# Patient Record
Sex: Female | Born: 1970 | Hispanic: Yes | Marital: Single
Health system: Southern US, Community
[De-identification: ages and names within clinical notes are randomized; demographics above are authoritative.]

---

## 2000-04-11 HISTORY — PX: AUGMENTATION MAMMAPLASTY: SUR837

## 2005-05-03 ENCOUNTER — Other Ambulatory Visit: Payer: Self-pay

## 2005-05-03 ENCOUNTER — Inpatient Hospital Stay: Payer: Self-pay | Admitting: Internal Medicine

## 2007-12-24 ENCOUNTER — Emergency Department: Payer: Self-pay | Admitting: Emergency Medicine

## 2007-12-24 ENCOUNTER — Other Ambulatory Visit: Payer: Self-pay

## 2011-11-27 ENCOUNTER — Emergency Department: Payer: Self-pay | Admitting: Emergency Medicine

## 2011-11-27 LAB — COMPREHENSIVE METABOLIC PANEL
Albumin: 3.6 g/dL (ref 3.4–5.0)
Alkaline Phosphatase: 124 U/L (ref 50–136)
Anion Gap: 7 (ref 7–16)
Calcium, Total: 8.9 mg/dL (ref 8.5–10.1)
Co2: 28 mmol/L (ref 21–32)
EGFR (African American): >60
EGFR (Non-African Amer.): >60
Osmolality: 280 (ref 275–301)
Potassium: 3.8 mmol/L (ref 3.5–5.1)
SGOT(AST): 28 U/L (ref 15–37)
SGPT (ALT): 37 U/L (ref 12–78)
Sodium: 140 mmol/L (ref 136–145)

## 2011-11-27 LAB — CBC WITH DIFFERENTIAL/PLATELET
Basophil %: 0.7 %
Eosinophil %: 2.7 %
HCT: 41.2 % (ref 35.0–47.0)
HGB: 13.8 g/dL (ref 12.0–16.0)
Lymphocyte #: 2.7 10*3/uL (ref 1.0–3.6)
Lymphocyte %: 30.6 %
MCV: 88 fL (ref 80–100)
Monocyte #: 0.8 x10 3/mm (ref 0.2–0.9)
Monocyte %: 9.3 %
Neutrophil #: 4.9 10*3/uL (ref 1.4–6.5)
WBC: 8.7 10*3/uL (ref 3.6–11.0)

## 2013-02-27 ENCOUNTER — Ambulatory Visit: Payer: Self-pay

## 2015-01-21 ENCOUNTER — Ambulatory Visit: Payer: Self-pay | Attending: Oncology

## 2015-01-21 ENCOUNTER — Ambulatory Visit
Admission: RE | Admit: 2015-01-21 | Discharge: 2015-01-21 | Disposition: A | Discharge status: Home / Self Care | Payer: Self-pay | Source: Ambulatory Visit | Attending: Oncology | Admitting: Oncology

## 2015-01-21 ENCOUNTER — Other Ambulatory Visit: Payer: Self-pay

## 2015-01-21 VITALS — BP 111/74 | HR 76 | Temp 97.1°F | Ht 68.0 in | Wt 183.4 lb

## 2015-01-21 DIAGNOSIS — Z0000 Encounter for general adult medical examination without abnormal findings: Secondary | ICD-10-CM

## 2015-01-21 NOTE — Progress Notes (Signed)
Subjective:     Patient ID: Angela Sharp, female   DOB: 08-21-1970, 44 y.o.   MRN: [id number]  HPI   Review of Systems     Objective:   Physical Exam  Pulmonary/Chest: Right breast exhibits no inverted nipple, no mass, no nipple discharge, no skin change and no tenderness. Left breast exhibits no inverted nipple, no mass, no nipple discharge, no skin change and no tenderness. Breasts are symmetrical.  Bilateral breast implants       Assessment:     44 year old hispanic patient presents for [facility] clinic visit.  Patient screened, and meets [facility] eligibility.  Patient does not have insurance, Medicare or Medicaid.  Handout given on Affordable Care Act.  Patient is transgender, and has bilateral breast implants. CBE unremarkable. Instructed patient on breast self-exam using teach back method    Plan:     Sent for bilateral screening mammogram.

## 2015-01-22 ENCOUNTER — Other Ambulatory Visit: Payer: Self-pay | Admitting: *Deleted

## 2015-01-22 DIAGNOSIS — N63 Unspecified lump in unspecified breast: Secondary | ICD-10-CM

## 2015-02-11 ENCOUNTER — Ambulatory Visit
Admission: RE | Admit: 2015-02-11 | Discharge: 2015-02-11 | Disposition: A | Discharge status: Home / Self Care | Payer: Self-pay | Source: Ambulatory Visit | Attending: Oncology | Admitting: Oncology

## 2015-02-11 DIAGNOSIS — N63 Unspecified lump in unspecified breast: Secondary | ICD-10-CM

## 2015-03-19 ENCOUNTER — Other Ambulatory Visit: Payer: Self-pay

## 2015-03-19 DIAGNOSIS — N63 Unspecified lump in unspecified breast: Secondary | ICD-10-CM

## 2015-04-07 NOTE — Progress Notes (Signed)
Letter mailed to patient to inform of scheduled six month follow-up ultrasound of right breast scheduled for 08/17/14 at 1:40.  Also left message for schedulers in [facility] regarding duplicate appointment on 08/18/14 . Unsure of reason for duplication, but Percell BostonJamie Bunting [facility] is correcting this.

## 2015-08-17 ENCOUNTER — Ambulatory Visit
Admission: RE | Admit: 2015-08-17 | Discharge: 2015-08-17 | Disposition: A | Discharge status: Home / Self Care | Payer: Self-pay | Source: Ambulatory Visit | Attending: Oncology | Admitting: Oncology

## 2015-08-17 DIAGNOSIS — N63 Unspecified lump in unspecified breast: Secondary | ICD-10-CM

## 2015-08-18 ENCOUNTER — Other Ambulatory Visit: Payer: Self-pay

## 2015-10-15 NOTE — Progress Notes (Signed)
Patient had a birads 3 result from ultrasound on Aug 17, 2015.   She is scheduled for annual [facility] screening, and will need diagnostic mammogram and ultrasound on that date.  Letter mailed to notify patient of appointment.  Copy to [facility]IS.

## 2016-02-17 ENCOUNTER — Ambulatory Visit: Payer: Self-pay | Attending: Oncology

## 2016-03-21 ENCOUNTER — Ambulatory Visit: Payer: Self-pay | Attending: Oncology

## 2016-03-21 ENCOUNTER — Ambulatory Visit
Admission: RE | Admit: 2016-03-21 | Discharge: 2016-03-21 | Disposition: A | Discharge status: Home / Self Care | Payer: Self-pay | Source: Ambulatory Visit | Attending: Oncology | Admitting: Oncology

## 2016-03-21 ENCOUNTER — Encounter (INDEPENDENT_AMBULATORY_CARE_PROVIDER_SITE_OTHER): Payer: Self-pay

## 2016-03-21 ENCOUNTER — Other Ambulatory Visit: Payer: Self-pay | Admitting: Oncology

## 2016-03-21 VITALS — BP 120/80 | HR 73 | Temp 97.9°F | Ht 68.5 in | Wt 189.3 lb

## 2016-03-21 DIAGNOSIS — Z0000 Encounter for general adult medical examination without abnormal findings: Secondary | ICD-10-CM

## 2016-03-21 DIAGNOSIS — N63 Unspecified lump in unspecified breast: Secondary | ICD-10-CM

## 2016-03-21 DIAGNOSIS — Z9882 Breast implant status: Secondary | ICD-10-CM | POA: Insufficient documentation

## 2016-03-21 DIAGNOSIS — Z1231 Encounter for screening mammogram for malignant neoplasm of breast: Secondary | ICD-10-CM | POA: Insufficient documentation

## 2016-03-21 NOTE — Progress Notes (Signed)
Subjective:     Patient ID: Angela Sharp, female   DOB: 1970/10/20, 45 y.o.   MRN: [id number]  HPI   Review of Systems     Objective:   Physical Exam  Pulmonary/Chest: Right breast exhibits no inverted nipple, no mass, no nipple discharge, no skin change and no tenderness. Left breast exhibits no inverted nipple, no mass, no nipple discharge, no skin change and no tenderness. Breasts are symmetrical.         Assessment:     45 year old patient presents for annual [facility] appointment.  Patient screened, and meets [facility] eligibility.  Patient does not have insurance, Medicare or Medicaid.  Handout given on Affordable Care Act. Instructed patient on breast self-exam using teach back method. Normal CBE.  Patient is transgender, and has bilateral breast implants.  She is taking estrogen supplement.  Maritza Afanador interpreted exam.  Patient was being followed for right breast cysts, but has returned to annual screening.    Plan:     Sent for bilateral screening mammogram.

## 2016-03-23 NOTE — Progress Notes (Signed)
Letter mailed from [facility] to notify of normal mammogram results.  Patient to return in one year for annual screening.  Copy to [facility]IS. 

## 2016-10-04 ENCOUNTER — Encounter: Payer: Self-pay | Admitting: Podiatry

## 2016-10-04 ENCOUNTER — Ambulatory Visit (INDEPENDENT_AMBULATORY_CARE_PROVIDER_SITE_OTHER): Payer: Self-pay | Admitting: Podiatry

## 2016-10-04 VITALS — BP 105/70 | HR 67 | Resp 16 | Ht 67.0 in | Wt 172.0 lb

## 2016-10-04 DIAGNOSIS — B359 Dermatophytosis, unspecified: Secondary | ICD-10-CM

## 2016-10-04 DIAGNOSIS — L603 Nail dystrophy: Secondary | ICD-10-CM

## 2016-10-04 DIAGNOSIS — B351 Tinea unguium: Secondary | ICD-10-CM

## 2016-10-04 MED ORDER — TERBINAFINE HCL 250 MG PO TABS: 250.0000 mg | ORAL_TABLET | Freq: Every day | ORAL | 0 refills | Status: AC

## 2016-10-04 NOTE — Progress Notes (Signed)
   Subjective:    Patient ID: Angela HailGladys Rivas Parra, female    DOB: March 09, 1971, 46 y.o.   MRN: [id number]  HPI    Review of Systems  All other systems reviewed and are negative.      Objective:   Physical Exam        Assessment & Plan:

## 2016-10-05 NOTE — Progress Notes (Signed)
   Subjective: Patient presents today for possible treatment and evaluation of fungal nails of bilateral great toes that have been ongoing for the past two years. She states she has taken Lamisil in the past, finishing three months ago, with some relief. Patient presents today for further treatment and evaluation.  Objective: Physical Exam General: The patient is alert and oriented x3 in no acute distress.  Dermatology: Hyperkeratotic, discolored, thickened, onychodystrophy of nails noted bilaterally.  Skin is warm, dry and supple bilateral lower extremities. Negative for open lesions or macerations.  Vascular: Palpable pedal pulses bilaterally. No edema or erythema noted. Capillary refill within normal limits.  Neurological: Epicritic and protective threshold grossly intact bilaterally.   Musculoskeletal Exam: Range of motion within normal limits to all pedal and ankle joints bilateral. Muscle strength 5/5 in all groups bilateral.   Assessment: #1 onychodystrophy bilateral toenails #2 possible onychomycosis #3 hyperkeratotic nails bilateral  Plan of Care:  #1 Patient was evaluated. #2 Orders for liver function tests were ordered today.  #3 Today nail biopsy was taken and sent to pathology for fungal culture. #4 Prescription for Lamisil 250 mg #90 given to patient. #5 Appt with Shanda BumpsJessica, RN for laser treatment. #6 Return to clinic in 4 months.    Felecia ShellingBrent M. Evans, DPM [facility]  Dr. Felecia ShellingBrent M. Evans, DPM    [address]                                        [place], [place] [address]                Office [phone]  Fax [phone]

## 2016-10-07 LAB — HEPATIC FUNCTION PANEL
ALBUMIN: 4.5 g/dL (ref 3.5–5.5)
ALK PHOS: 69 IU/L (ref 39–117)
ALT: 28 IU/L (ref 0–32)
AST: 21 IU/L (ref 0–40)
BILIRUBIN TOTAL: 0.3 mg/dL (ref 0.0–1.2)
BILIRUBIN, DIRECT: 0.09 mg/dL (ref 0.00–0.40)
Total Protein: 7.2 g/dL (ref 6.0–8.5)

## 2016-10-18 ENCOUNTER — Ambulatory Visit (INDEPENDENT_AMBULATORY_CARE_PROVIDER_SITE_OTHER): Payer: Self-pay | Admitting: Podiatry

## 2016-10-18 DIAGNOSIS — B351 Tinea unguium: Secondary | ICD-10-CM

## 2016-10-18 DIAGNOSIS — B359 Dermatophytosis, unspecified: Secondary | ICD-10-CM

## 2016-10-18 NOTE — Progress Notes (Signed)
Pt presents with mycotic infection of nails 1-5 bilateral  All other systems are negative  Laser therapy administered to affected nails and tolerated well. All safety precautions were in place. Re-appointed in 4 weeks for 2nd treatment 

## 2016-11-22 ENCOUNTER — Other Ambulatory Visit: Payer: Self-pay

## 2016-11-22 DIAGNOSIS — B351 Tinea unguium: Secondary | ICD-10-CM

## 2016-11-30 ENCOUNTER — Ambulatory Visit (INDEPENDENT_AMBULATORY_CARE_PROVIDER_SITE_OTHER): Payer: Self-pay | Admitting: Podiatry

## 2016-11-30 DIAGNOSIS — B351 Tinea unguium: Secondary | ICD-10-CM

## 2016-12-05 NOTE — Progress Notes (Signed)
Pt presents with mycotic infection of nails 1-5 bilateral  All other systems are negative  Laser therapy administered to affected nails and tolerated well. All safety precautions were in place. Re-appointed in 4 weeks for 3rd treatment 

## 2016-12-28 ENCOUNTER — Ambulatory Visit: Payer: Self-pay

## 2016-12-28 DIAGNOSIS — B351 Tinea unguium: Secondary | ICD-10-CM

## 2016-12-28 DIAGNOSIS — B359 Dermatophytosis, unspecified: Secondary | ICD-10-CM

## 2016-12-28 MED ORDER — CLOTRIMAZOLE-BETAMETHASONE 1-0.05 % EX CREA: 1.0000 "application " | TOPICAL_CREAM | Freq: Two times a day (BID) | CUTANEOUS | 0 refills | Status: DC

## 2016-12-28 NOTE — Progress Notes (Signed)
Pt presents with mycotic infection of nails 1-5 bilateral  All other systems are negative  Laser therapy administered to affected nails and tolerated well. All safety precautions were in place. Re-appointed in 4 weeks for 4th treatment. Rx for lotrisone cream sent to pharmacy per Dr Logan Bores

## 2017-01-24 ENCOUNTER — Ambulatory Visit (INDEPENDENT_AMBULATORY_CARE_PROVIDER_SITE_OTHER): Payer: Self-pay | Admitting: Podiatry

## 2017-01-24 DIAGNOSIS — B351 Tinea unguium: Secondary | ICD-10-CM

## 2017-01-24 DIAGNOSIS — B359 Dermatophytosis, unspecified: Secondary | ICD-10-CM

## 2017-01-24 MED ORDER — CLOTRIMAZOLE-BETAMETHASONE 1-0.05 % EX CREA: 1.0000 "application " | TOPICAL_CREAM | Freq: Two times a day (BID) | CUTANEOUS | 0 refills | Status: DC

## 2017-01-24 NOTE — Progress Notes (Signed)
Pt presents with mycotic infection of nails 1-5 bilateral  All other systems are negative  Laser therapy administered to affected nails and tolerated well. All safety precautions were in place. Re-appointed in 4 weeks for 5th treatment. Refill for lotrisone cream sent to pharmacy per Dr Logan Bores

## 2017-01-26 ENCOUNTER — Ambulatory Visit: Payer: Self-pay

## 2017-02-28 ENCOUNTER — Ambulatory Visit (INDEPENDENT_AMBULATORY_CARE_PROVIDER_SITE_OTHER): Payer: Self-pay | Admitting: Podiatry

## 2017-02-28 DIAGNOSIS — B351 Tinea unguium: Secondary | ICD-10-CM

## 2017-02-28 DIAGNOSIS — B359 Dermatophytosis, unspecified: Secondary | ICD-10-CM

## 2017-02-28 MED ORDER — CLOTRIMAZOLE-BETAMETHASONE 1-0.05 % EX CREA: 1.0000 "application " | TOPICAL_CREAM | Freq: Two times a day (BID) | CUTANEOUS | 0 refills | Status: AC

## 2017-03-06 NOTE — Progress Notes (Signed)
Pt presents with mycotic infection of nails 1-5 bilateral  All other systems are negative  Laser therapy administered to affected nails and tolerated well. All safety precautions were in place. Re-appointed in 4 weeks for 6th treatment. Refill for lotrisone cream sent to pharmacy per Dr Logan BoresEvans

## 2017-03-28 ENCOUNTER — Ambulatory Visit: Payer: Self-pay

## 2017-03-31 ENCOUNTER — Ambulatory Visit: Payer: Self-pay | Admitting: Podiatry

## 2017-03-31 DIAGNOSIS — B351 Tinea unguium: Secondary | ICD-10-CM

## 2017-04-10 ENCOUNTER — Ambulatory Visit: Payer: Self-pay | Attending: Oncology | Admitting: *Deleted

## 2017-04-10 VITALS — BP 107/73 | HR 70 | Temp 97.5°F | Ht 68.0 in | Wt 194.0 lb

## 2017-04-10 DIAGNOSIS — Z0000 Encounter for general adult medical examination without abnormal findings: Secondary | ICD-10-CM

## 2017-04-10 NOTE — Progress Notes (Signed)
Subjective:     Patient ID: Angela Sharp, adult   DOB: 07-Sep-1970, 46 y.o.   MRN: [id number]  HPI   Review of Systems     Objective:   Physical Exam  Pulmonary/Chest: Right breast exhibits no inverted nipple, no mass, no nipple discharge, no skin change and no tenderness. Left breast exhibits no inverted nipple, no mass, no nipple discharge, no skin change and no tenderness. Breasts are symmetrical.         Assessment:     46 year old transgender Hispanic female [facility]  Orson SlickJacqui, the interpreter present during the interview and exam.  Patient states she is having her implants removed on Thursday, and that the saline has already been removed.  On clinical breast exam there is palpable mobile firm, probable implants bilateral at 6:00.  There is no dominant mass, skin changes, nipple discharge or lymphadenopathy.  I did call the breast center and spoke to [facility] about doing the patients mammogram today or waiting until after surgery.  Marcelino DusterMichelle states she spoke to the radiologist, who recommended that the patient return after surgery when the surgeon agrees it is ok to proceed with mammogram.  Explained to patient.  She is going to call me or Joellyn QuailsChristy Burton back to schedule her mammogram.  Taught self breast awareness.  Patient has been screened for eligibility.  She does not have any insurance, Medicare or Medicaid.  She also meets financial eligibility.  Hand-out given on the Affordable Care Act.    Plan:     Screening mammogram ordered.  Will follow-up per [facility] protocol.

## 2017-04-10 NOTE — Patient Instructions (Signed)
Gave patient hand-out, Women Staying Healthy, Active and Well from [facility], with education on breast health, pap smears, heart and colon health. 

## 2017-04-13 ENCOUNTER — Encounter: Payer: Self-pay | Admitting: *Deleted

## 2017-04-19 NOTE — Progress Notes (Signed)
Pt presents with mycotic infection of nails 1-5 bilateral  All other systems are negative  Laser therapy administered to affected nails and tolerated well. All safety precautions were in place. Re-appointed prn 

## 2017-05-06 IMAGING — MG MM DIGITAL SCREENING IMPLANTS W/ CAD
8 of 9 series · 8 of 9 positions shown · non-contrast
Comparison: Previous exam(s).

CLINICAL DATA: Screening.

EXAM:
DIGITAL SCREENING BILATERAL MAMMOGRAM WITH IMPLANTS AND CAD
The patient has retropectoral implants. Standard and implant
displaced views were performed.

[L CC (1 of 2)]
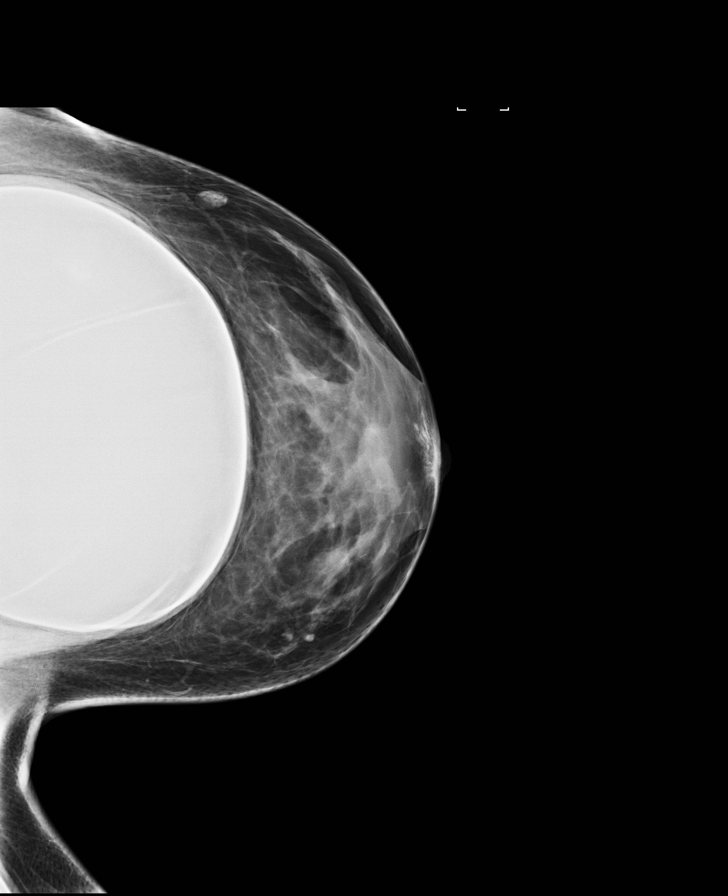

[L MLO (1 of 2)]
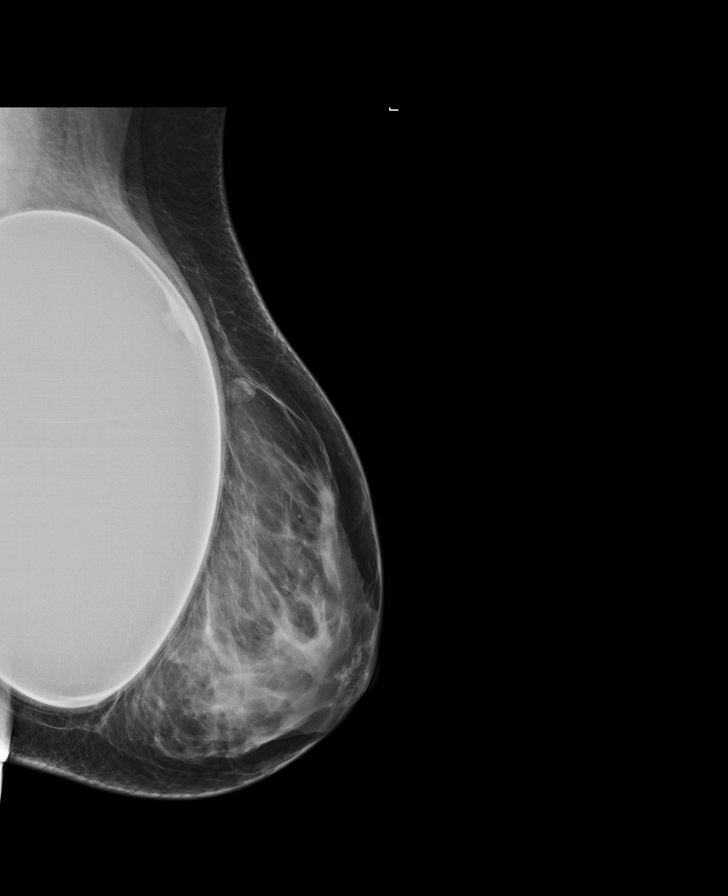

[R MLO (1 of 2)]
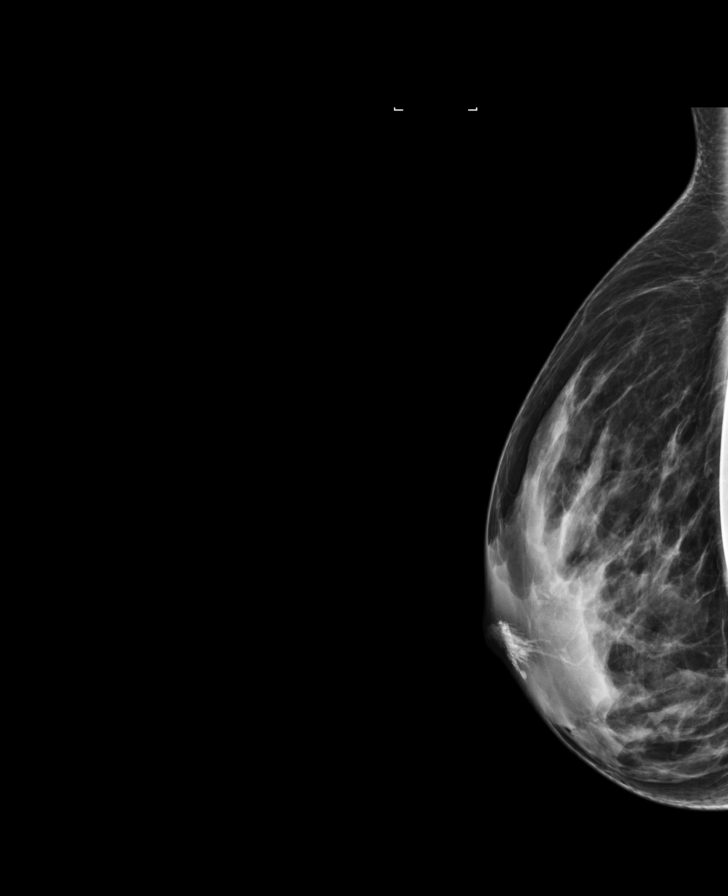

[R CC (1 of 2)]
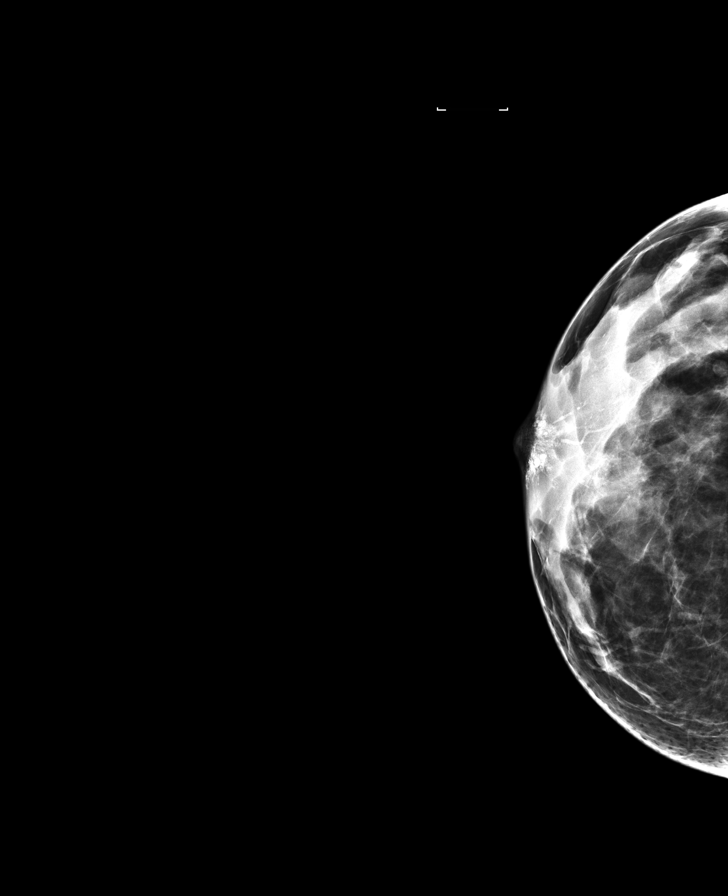

[R CC (2 of 2)]
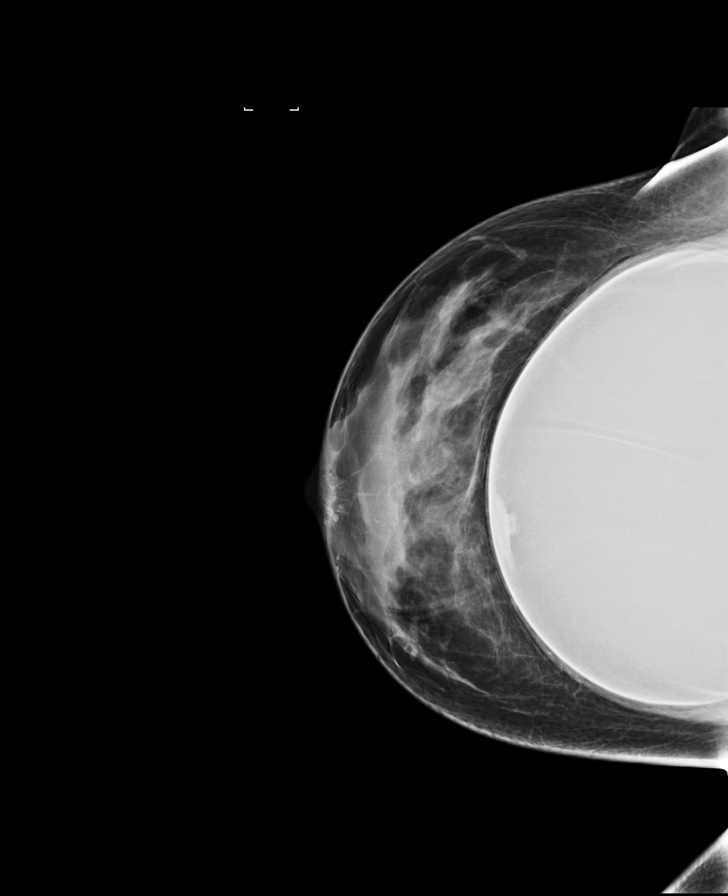

[L CC (2 of 2)]
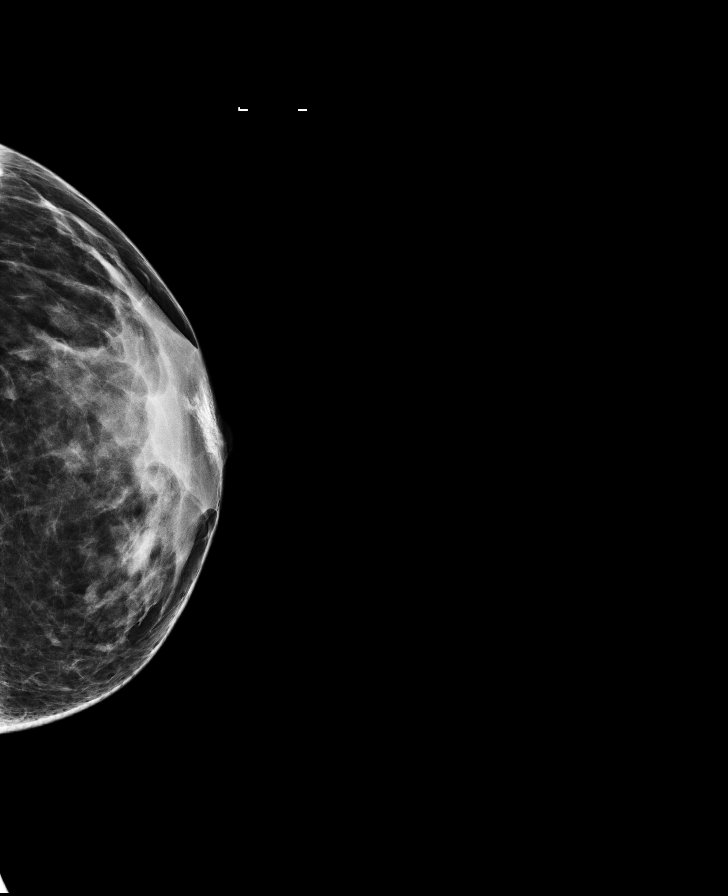

[R MLO (2 of 2)]
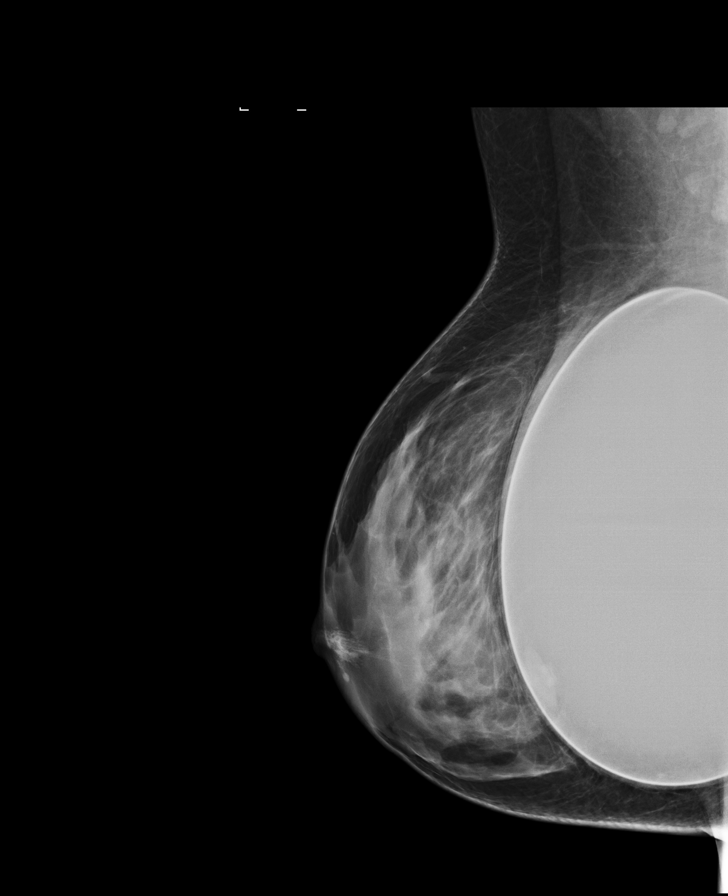

[L MLO (2 of 2)]
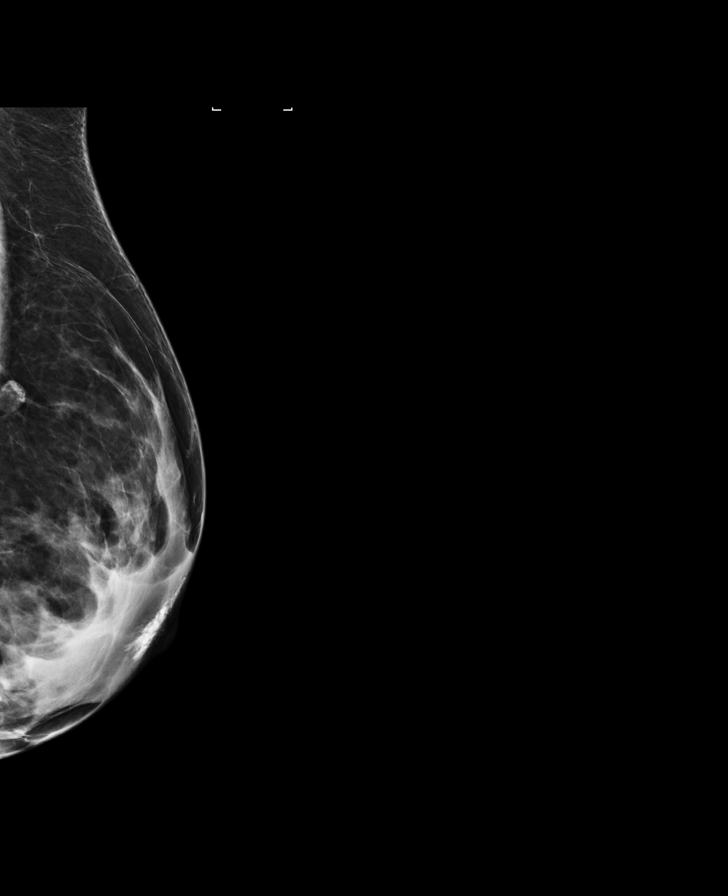

[8 of 9 positions shown; findings below may reference images not displayed]

ACR Breast Density Category c: The breast tissue is heterogeneously
dense, which may obscure small masses.
FINDINGS: In the right breast possible mass requires further evaluation.

In the left breast possible mass and asymmetry require further
evaluation.

Images were processed with CAD.
IMPRESSION: Further evaluation is suggested for possible mass in the right
breast.

Further evaluation is suggested for possible mass and asymmetry in
the left breast.

The patient will be contacted regarding the findings, and additional
imaging will be scheduled.

RECOMMENDATION:
Diagnostic mammogram and possibly ultrasound of both breasts.
(Code:V3-B-GGQ)

BI-RADS CATEGORY  0: Incomplete. Need additional imaging evaluation
and/or prior mammograms for comparison.

## 2017-07-04 ENCOUNTER — Telehealth: Payer: Self-pay | Admitting: *Deleted

## 2017-07-04 NOTE — Telephone Encounter (Addendum)
Tried to call patient to see if she had had her surgery.  No answer and no voicemail set up.  Will try again at a later date.  Patient returned my call.  States she had her surgery and she is scheduled for a follow up appointment in 6 months.  She is to call back at that time when she has approval to go forward with her next mammogram.  I will schedule her to return to [facility] at that time. She is agreeable to the plan.

## 2018-06-11 ENCOUNTER — Other Ambulatory Visit: Payer: Self-pay | Admitting: *Deleted

## 2018-06-11 ENCOUNTER — Ambulatory Visit: Payer: Self-pay | Attending: Oncology | Admitting: *Deleted

## 2018-06-11 ENCOUNTER — Encounter: Payer: Self-pay | Admitting: *Deleted

## 2018-06-11 ENCOUNTER — Ambulatory Visit
Admission: RE | Admit: 2018-06-11 | Discharge: 2018-06-11 | Disposition: A | Discharge status: Home / Self Care | Payer: Self-pay | Source: Ambulatory Visit | Attending: Oncology | Admitting: Oncology

## 2018-06-11 ENCOUNTER — Encounter (INDEPENDENT_AMBULATORY_CARE_PROVIDER_SITE_OTHER): Payer: Self-pay

## 2018-06-11 VITALS — BP 108/70 | HR 67 | Temp 98.5°F | Ht 69.0 in | Wt 182.5 lb

## 2018-06-11 DIAGNOSIS — Z0000 Encounter for general adult medical examination without abnormal findings: Secondary | ICD-10-CM

## 2018-06-11 NOTE — Progress Notes (Signed)
  Subjective:     Patient ID: Angela Sharp, adult   DOB: 1970-04-21, 48 y.o.   MRN: [id number]  HPI   Review of Systems     Objective:   Physical Exam Chest:     Breasts:        Right: Inverted nipple present. No swelling, bleeding, mass, nipple discharge, skin change or tenderness.        Left: Inverted nipple present. No swelling, bleeding, mass, nipple discharge, skin change or tenderness.       Comments: Bilateral nipples are inverted.  Patient states it has been since her last surgery and was told by her surgeon this was normal. Lymphadenopathy:     Upper Body:     Right upper body: No supraclavicular or axillary adenopathy.     Left upper body: No supraclavicular or axillary adenopathy.        Assessment:     48 year old Hispanic transgender female returns to [facility] for annual screening.  Lloyda, the interpreter present during the interview and exam.  Patient last seen in [facility]  on 04/10/17, and at that time she in the process of having her implants removed.  It was recommended at that time by radiology for her to wait for her mammogram until after her surgery was complete.  It has been a year since completion of her surgery.  On clinical breast exam bilateral nipples are inverted.  Patient states they have been this way since her surgery.  Asked patient if she discussed this with her surgeon, and she states he told her this was going to happen.  Taught self breast awareness.  She is currently using an estrogen patch.  Patient has been screened for eligibility.  She does not have any insurance, Medicare or Medicaid.  She also meets financial eligibility.  Hand-out given on the Affordable Care Act.  Risk Assessment    The Dondra Spry risk assessment model is based on data from cisgender female patients and might not be accurate for patients of other genders or sexes.   Risk Scores      06/11/2018   Last edited by: Alta Corning, CMA   5-year risk: 0.6 %   Lifetime risk: 6.9 %            Plan:     Screening mammogram ordered.  Will follow-up per [facility] protocol.

## 2018-06-11 NOTE — Patient Instructions (Signed)
Gave patient hand-out, Women Staying Healthy, Active and Well from [facility], with education on breast health, pap smears, heart and colon health. 

## 2018-06-12 ENCOUNTER — Encounter: Payer: Self-pay | Admitting: *Deleted

## 2018-06-12 NOTE — Progress Notes (Signed)
[facility] mailed [facility] to inform patient of her normal mammogram results.  Patient is to follow-up with annual screening in one year.  HSIS to Christy. 

## 2019-08-06 ENCOUNTER — Ambulatory Visit
Admission: RE | Admit: 2019-08-06 | Discharge: 2019-08-06 | Disposition: A | Discharge status: Home / Self Care | Payer: Self-pay | Source: Ambulatory Visit | Attending: Oncology | Admitting: Oncology

## 2019-08-06 ENCOUNTER — Other Ambulatory Visit: Payer: Self-pay

## 2019-08-06 ENCOUNTER — Ambulatory Visit: Payer: Self-pay | Attending: Oncology | Admitting: *Deleted

## 2019-08-06 ENCOUNTER — Encounter: Payer: Self-pay | Admitting: *Deleted

## 2019-08-06 VITALS — BP 113/79 | HR 64 | Temp 97.7°F | Ht 68.5 in | Wt 178.9 lb

## 2019-08-06 DIAGNOSIS — Z0000 Encounter for general adult medical examination without abnormal findings: Secondary | ICD-10-CM

## 2019-08-06 NOTE — Progress Notes (Signed)
  Subjective:     Patient ID: Angela Sharp, adult   DOB: 1970-06-15, 49 y.o.   MRN: [id number]  HPI   Review of Systems     Objective:   Physical Exam Chest:     Breasts:        Right: Inverted nipple present. No swelling, bleeding, mass, nipple discharge, skin change or tenderness.        Left: Inverted nipple present. No swelling, bleeding, mass, nipple discharge, skin change or tenderness.    Lymphadenopathy:     Upper Body:     Right upper body: No supraclavicular adenopathy.     Left upper body: No supraclavicular adenopathy.        Assessment:     49 year old transgender female returns to [facility] for annual screening.  Refused interpreter today.  On clinical breast exam, bilateral breast have inverted nipples.  Patient states they have been this way since her surgery.  Patient did have her implants removed in 2018/ 2019.  There are previous surgical scars noted.  There is no dominant mass, nipple discharge, skin changes or lymphadenopathy.  Taught self breast awareness.  Patient has been screened for eligibility.  She does not have any insurance, Medicare or Medicaid.  She also meets financial eligibility.  The gail model breast cancer risk does not apply to transgender.      Plan:     Screening mammogram ordered.  Patient did get her last Covid 19 vaccine on 07/17/19.  Reviewed with patient that she may be called back if she has enlarged axillary lymph nodes.  Voices understanding and would like to proceed with mammogram today rather than waiting.  Will follow per [facility] protocol.

## 2019-08-06 NOTE — Patient Instructions (Signed)
Gave patient hand-out, Women Staying Healthy, Active and Well from [facility], with education on breast health, pap smears, heart and colon health. 

## 2019-08-07 ENCOUNTER — Encounter: Payer: Self-pay | Admitting: *Deleted

## 2019-08-07 NOTE — Progress Notes (Signed)
Letter mailed from [facility] to inform patient of her normal mammogram results.  Patient is to follow-up with annual screening in one year. 

## 2021-03-13 ENCOUNTER — Encounter: Payer: Self-pay | Admitting: Emergency Medicine

## 2021-03-13 ENCOUNTER — Other Ambulatory Visit: Payer: Self-pay

## 2021-03-13 ENCOUNTER — Ambulatory Visit
Admission: EM | Admit: 2021-03-13 | Discharge: 2021-03-13 | Disposition: A | Discharge status: Home / Self Care | Payer: Self-pay | Attending: Family Medicine | Admitting: Family Medicine

## 2021-03-13 DIAGNOSIS — G4700 Insomnia, unspecified: Secondary | ICD-10-CM

## 2021-03-13 DIAGNOSIS — F419 Anxiety disorder, unspecified: Secondary | ICD-10-CM | POA: Insufficient documentation

## 2021-03-13 LAB — COMPREHENSIVE METABOLIC PANEL
ALT: 28 U/L (ref 0–44)
AST: 20 U/L (ref 15–41)
Albumin: 4.7 g/dL (ref 3.5–5.0)
Alkaline Phosphatase: 80 U/L (ref 38–126)
Anion gap: 10 (ref 5–15)
BUN: 17 mg/dL (ref 6–20)
CO2: 24 mmol/L (ref 22–32)
Calcium: 9.3 mg/dL (ref 8.9–10.3)
Chloride: 101 mmol/L (ref 98–111)
Creatinine, Ser: 0.79 mg/dL (ref 0.44–1.00)
GFR, Estimated: >60 mL/min (ref >60)
Glucose, Bld: 96 mg/dL (ref 70–99)
Potassium: 4.1 mmol/L (ref 3.5–5.1)
Sodium: 135 mmol/L (ref 135–145)
Total Bilirubin: 0.8 mg/dL (ref 0.3–1.2)
Total Protein: 7.9 g/dL (ref 6.5–8.1)

## 2021-03-13 LAB — CBC
HCT: 44.8 % (ref 36.0–46.0)
Hemoglobin: 15 g/dL (ref 12.0–15.0)
MCH: 29.8 pg (ref 26.0–34.0)
MCHC: 33.5 g/dL (ref 30.0–36.0)
MCV: 88.9 fL (ref 80.0–100.0)
Platelets: 290 10*3/uL (ref 150–400)
RBC: 5.04 MIL/uL (ref 3.87–5.11)
RDW: 12.2 % (ref 11.5–15.5)
WBC: 8.9 10*3/uL (ref 4.0–10.5)
nRBC: 0 % (ref 0.0–0.2)

## 2021-03-13 LAB — TSH: TSH: 0.379 u[IU]/mL (ref 0.350–4.500)

## 2021-03-13 MED ORDER — ESZOPICLONE 3 MG PO TABS: 3.0000 mg | ORAL_TABLET | Freq: Every evening | ORAL | 0 refills | Status: DC | PRN

## 2021-03-13 MED ORDER — ESZOPICLONE 3 MG PO TABS: 3.0000 mg | ORAL_TABLET | Freq: Every evening | ORAL | 0 refills | Status: AC | PRN

## 2021-03-13 NOTE — ED Provider Notes (Signed)
   [facility]   [id number] 03/13/21 Arrival Time: 0955  ASSESSMENT & PLAN:  1. Insomnia, unspecified type   2. Anxiety    Patient requests that we call lab results to her friend Josefina at [phone]. Speaks English.  Trial: Meds ordered this encounter  Medications   Eszopiclone 3 MG TABS    Sig: Take 1 tablet (3 mg total) by mouth at bedtime as needed. Take immediately before bedtime    Dispense:  7 tablet    Refill:  0   Recommend:  Follow-up Information     Schedule an appointment as soon as possible for a visit  with Sandrea Hughs, NP.   Specialty: Nurse Practitioner Contact information: [address] [place] [address] [phone]                 Reviewed expectations re: course of current medical issues. Questions answered. Outlined signs and symptoms indicating need for more acute intervention. Understanding verbalized. After Visit Summary given.   SUBJECTIVE: History from: patient. Spanish interpreter used. Angela Sharp is a 50 y.o. adult who reports: insomnia x 1.5 months; no life changes reported. H/O anxiety with occas exacerbations. Occas body aches. Has been told she may have a thyroid problem at OSH; records unavailable. Reports that she someone told her that her thyroid tests were abnormal. Desires lab work.   OBJECTIVE:  Vitals:   03/13/21 1105 03/13/21 1107  BP: 112/76   Pulse: 76   Temp:  98.6 F (37 C)  TempSrc: Oral Oral  SpO2: 97%     General appearance: alert; no distress Eyes: PERRLA; EOMI; conjunctiva normal HENT: [place]; AT; without nasal congestion Neck: supple  Lungs: speaks full sentences without difficulty; unlabored Extremities: no edema Skin: warm and dry Neurologic: normal gait Psychological: alert and cooperative; normal mood and affect  Labs:  Labs Reviewed  CBC  COMPREHENSIVE METABOLIC PANEL  TSH     No Known Allergies  History reviewed. No pertinent past medical  history. Social History   Socioeconomic History   Marital status: Single    Spouse name: Not on file   Number of children: Not on file   Years of education: Not on file   Highest education level: Not on file  Occupational History   Not on file  Tobacco Use   Smoking status: Unknown   Smokeless tobacco: Not on file  Vaping Use   Vaping Use: Never used  Substance and Sexual Activity   Alcohol use: Not on file   Drug use: Not on file   Sexual activity: Not on file  Other Topics Concern   Not on file  Social History Narrative   Not on file   Social Determinants of Health   Financial Resource Strain: Not on file  Food Insecurity: Not on file  Transportation Needs: Not on file  Physical Activity: Not on file  Stress: Not on file  Social Connections: Not on file  Intimate Partner Violence: Not on file   Family History  Problem Relation Age of Onset   Breast cancer Neg Hx    Past Surgical History:  Procedure Laterality Date   AUGMENTATION MAMMAPLASTY Bilateral 2002   gel saline- removed 2019     Mardella Layman, MD 03/15/21 1141

## 2021-03-13 NOTE — ED Triage Notes (Signed)
Pt states she has not been able to sleep x 1.5 months.  She takes Nyquil and melatonin. She states her whole body hurts and she even has pain in her eyeballs. Patient is tearful during triage. Spanish interpreter used.

## 2021-03-13 NOTE — Discharge Instructions (Addendum)
You have had labs (blood work) drawn today. We will call you with any significant abnormalities or if there is need to begin or change treatment or pursue further follow up.  You may also review your test results online through MyChart. If you do not have a MyChart account, instructions to sign up should be on your discharge paperwork.  

## 2021-03-21 ENCOUNTER — Emergency Department
Admission: EM | Admit: 2021-03-21 | Discharge: 2021-03-21 | Disposition: A | Discharge status: Home / Self Care | Payer: Self-pay | Attending: Emergency Medicine | Admitting: Emergency Medicine

## 2021-03-21 ENCOUNTER — Other Ambulatory Visit: Payer: Self-pay

## 2021-03-21 ENCOUNTER — Emergency Department: Payer: Self-pay

## 2021-03-21 DIAGNOSIS — R110 Nausea: Secondary | ICD-10-CM | POA: Insufficient documentation

## 2021-03-21 DIAGNOSIS — R197 Diarrhea, unspecified: Secondary | ICD-10-CM | POA: Insufficient documentation

## 2021-03-21 DIAGNOSIS — Z20822 Contact with and (suspected) exposure to covid-19: Secondary | ICD-10-CM | POA: Insufficient documentation

## 2021-03-21 DIAGNOSIS — R1013 Epigastric pain: Secondary | ICD-10-CM | POA: Insufficient documentation

## 2021-03-21 DIAGNOSIS — R1011 Right upper quadrant pain: Secondary | ICD-10-CM | POA: Insufficient documentation

## 2021-03-21 LAB — COMPREHENSIVE METABOLIC PANEL
ALT: 30 U/L (ref 0–44)
AST: 18 U/L (ref 15–41)
Albumin: 4.4 g/dL (ref 3.5–5.0)
Alkaline Phosphatase: 82 U/L (ref 38–126)
Anion gap: 7 (ref 5–15)
BUN: 15 mg/dL (ref 6–20)
CO2: 26 mmol/L (ref 22–32)
Calcium: 9.1 mg/dL (ref 8.9–10.3)
Chloride: 102 mmol/L (ref 98–111)
Creatinine, Ser: 0.76 mg/dL (ref 0.44–1.00)
GFR, Estimated: >60 mL/min (ref >60)
Glucose, Bld: 100 mg/dL — ABNORMAL HIGH (ref 70–99)
Potassium: 3.8 mmol/L (ref 3.5–5.1)
Sodium: 135 mmol/L (ref 135–145)
Total Bilirubin: 0.5 mg/dL (ref 0.3–1.2)
Total Protein: 7.2 g/dL (ref 6.5–8.1)

## 2021-03-21 LAB — CBC
HCT: 42.5 % (ref 36.0–46.0)
Hemoglobin: 14 g/dL (ref 12.0–15.0)
MCH: 29.5 pg (ref 26.0–34.0)
MCHC: 32.9 g/dL (ref 30.0–36.0)
MCV: 89.5 fL (ref 80.0–100.0)
Platelets: 261 10*3/uL (ref 150–400)
RBC: 4.75 MIL/uL (ref 3.87–5.11)
RDW: 12.1 % (ref 11.5–15.5)
WBC: 8.6 10*3/uL (ref 4.0–10.5)
nRBC: 0 % (ref 0.0–0.2)

## 2021-03-21 LAB — URINALYSIS, ROUTINE W REFLEX MICROSCOPIC
Bilirubin Urine: NEGATIVE
Glucose, UA: NEGATIVE mg/dL
Hgb urine dipstick: NEGATIVE
Ketones, ur: NEGATIVE mg/dL
Leukocytes,Ua: NEGATIVE
Nitrite: NEGATIVE
Protein, ur: NEGATIVE mg/dL
Specific Gravity, Urine: 1.01 (ref 1.005–1.030)
pH: 7 (ref 5.0–8.0)

## 2021-03-21 LAB — RESP PANEL BY RT-PCR (FLU A&B, COVID) ARPGX2
Influenza A by PCR: NEGATIVE
Influenza B by PCR: NEGATIVE
SARS Coronavirus 2 by RT PCR: NEGATIVE

## 2021-03-21 LAB — LIPASE, BLOOD: Lipase: 36 U/L (ref 11–51)

## 2021-03-21 LAB — POC URINE PREG, ED: Preg Test, Ur: NEGATIVE

## 2021-03-21 MED ORDER — DICYCLOMINE HCL 10 MG PO CAPS: 10.0000 mg | ORAL_CAPSULE | Freq: Three times a day (TID) | ORAL | 0 refills | Status: AC

## 2021-03-21 NOTE — ED Provider Notes (Signed)
[facility] Emergency Department Provider Note   ____________________________________________   Event Date/Time   First MD Initiated Contact with Patient 03/21/21 2014     (approximate)  I have reviewed the triage vital signs and the nursing notes.   HISTORY  Chief Complaint Abdominal Pain    HPI Angela Sharp is a 50 y.o. adult who presents complaining of epigastric abdominal pain  LOCATION: Epigastric region DURATION: 1 month prior to arrival TIMING: Intermittent SEVERITY: 6/10 QUALITY: Burning abdominal pain CONTEXT: Patient states that over the past month she has been experiencing postprandial abdominal pain with associated diarrhea and nausea MODIFYING FACTORS: Worsened with p.o. intake and denies any relieving factors ASSOCIATED SYMPTOMS: Nausea/diarrhea   Per medical record review, patient has history of breast augmentation          History reviewed. No pertinent past medical history.  There are no problems to display for this patient.   Past Surgical History:  Procedure Laterality Date   AUGMENTATION MAMMAPLASTY Bilateral 2002   gel saline- removed 2019    Prior to Admission medications   Medication Sig Start Date End Date Taking? Authorizing Provider  dicyclomine (BENTYL) 10 MG capsule Take 1 capsule (10 mg total) by mouth 4 (four) times daily -  before meals and at bedtime. 03/21/21 04/20/21 Yes Kamareon Sciandra, Clent Jacks, MD  clotrimazole-betamethasone (LOTRISONE) cream Apply 1 application 2 (two) times daily topically. 02/28/17   Felecia Shelling, DPM  Eszopiclone 3 MG TABS Take 1 tablet (3 mg total) by mouth at bedtime as needed. Take immediately before bedtime 03/13/21   Mardella Layman, MD  terbinafine (LAMISIL) 250 MG tablet Take 1 tablet (250 mg total) by mouth daily. 10/04/16   Felecia Shelling, DPM  UNABLE TO FIND Pt's interpreter stated, "She takes Estrogen"    [provider]    Allergies Patient has no known  allergies.  Family History  Problem Relation Age of Onset   Breast cancer Neg Hx     Social History Social History   Tobacco Use   Smoking status: Unknown  Vaping Use   Vaping Use: Never used    Review of Systems Constitutional: No fever/chills Eyes: No visual changes. ENT: No sore throat. Cardiovascular: Denies chest pain. Respiratory: Denies shortness of breath. Gastrointestinal: Endorses abdominal pain.  Endorses nausea, no vomiting.  Endorses diarrhea. Genitourinary: Negative for dysuria. Musculoskeletal: Negative for acute arthralgias Skin: Negative for rash. Neurological: Negative for headaches, weakness/numbness/paresthesias in any extremity Psychiatric: Negative for suicidal ideation/homicidal ideation   ____________________________________________   PHYSICAL EXAM:  VITAL SIGNS: ED Triage Vitals  Enc Vitals Group     BP 03/21/21 1738 112/72     Pulse Rate 03/21/21 1738 73     Resp 03/21/21 1738 18     Temp 03/21/21 1738 98.4 F (36.9 C)     Temp Source 03/21/21 1738 Oral     SpO2 03/21/21 1738 99 %     Weight 03/21/21 1738 172 lb (78 kg)     Height 03/21/21 1738 5\' 6"  (1.676 m)     Head Circumference --      Peak Flow --      Pain Score 03/21/21 1741 6     Pain Loc --      Pain Edu? --      Excl. in GC? --    Constitutional: Alert and oriented. Well appearing and in no acute distress. Eyes: Conjunctivae are normal. PERRL. Head: Atraumatic. Nose: No congestion/rhinnorhea. Mouth/Throat: Mucous membranes  are moist. Neck: No stridor Cardiovascular: Grossly normal heart sounds.  Good peripheral circulation. Respiratory: Normal respiratory effort.  No retractions. Gastrointestinal: Soft and mild midepigastric tenderness to palpation. No distention. Musculoskeletal: No obvious deformities Neurologic:  Normal speech and language. No gross focal neurologic deficits are appreciated. Skin:  Skin is warm and dry. No rash noted. Psychiatric: Mood and  affect are normal. Speech and behavior are normal.  ____________________________________________   LABS (all labs ordered are listed, but only abnormal results are displayed)  Labs Reviewed  COMPREHENSIVE METABOLIC PANEL - Abnormal; Notable for the following components:      Result Value   Glucose, Bld 100 (*)    All other components within normal limits  URINALYSIS, ROUTINE W REFLEX MICROSCOPIC - Abnormal; Notable for the following components:   Color, Urine STRAW (*)    APPearance CLEAR (*)    All other components within normal limits  RESP PANEL BY RT-PCR (FLU A&B, COVID) ARPGX2  LIPASE, BLOOD  CBC  POC URINE PREG, ED   ____________________________________________ RADIOLOGY  ED MD interpretation: CT of the abdomen and pelvis shows no evidence of acute abnormalities  Official radiology report(s): CT Abdomen Pelvis Wo Contrast  Result Date: 03/21/2021 CLINICAL DATA:  Abdominal pain for 3 days, nausea and vomiting, diarrhea EXAM: CT ABDOMEN AND PELVIS WITHOUT CONTRAST TECHNIQUE: Multidetector CT imaging of the abdomen and pelvis was performed following the standard protocol without IV contrast. Unenhanced CT was performed per clinician order. Lack of IV contrast limits sensitivity and specificity, especially for evaluation of abdominal/pelvic solid viscera. COMPARISON:  None. FINDINGS: Lower chest: No acute pleural or parenchymal lung disease. Hepatobiliary: 2.9 cm cyst right lobe liver. Otherwise unremarkable unenhanced appearance of the liver. The gallbladder is surgically absent. Pancreas: Unremarkable unenhanced appearance. Spleen: Unremarkable unenhanced appearance. Adrenals/Urinary Tract: 9.2 cm simple cyst within the lower pole left kidney. No urinary tract calculi or obstructive uropathy within either kidney. Bladder is grossly unremarkable. Adrenals are normal. Stomach/Bowel: No bowel obstruction or ileus. Normal appendix right lower quadrant. Scattered diverticulosis within  the distal colon, with no evidence of diverticulitis. No bowel wall thickening or inflammatory change. Vascular/Lymphatic: No significant vascular findings are present. No enlarged abdominal or pelvic lymph nodes. Reproductive: Prostate is unremarkable. 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 Other: No free fluid or free gas.  No abdominal wall hernia. Musculoskeletal: No acute or destructive bony lesions. Multiple soft tissue nodules in the subcutaneous fat of the buttocks could reflect sequela of prior injections. Reconstructed images demonstrate no additional findings. IMPRESSION: 1. No acute intra-abdominal or intrapelvic process. 2. Hepatic and left renal cysts. Electronically Signed   By: Sharlet Salina M.D.   On: 03/21/2021 19:21    ____________________________________________   PROCEDURES  Procedure(s) performed (including Critical Care):  Procedures   ____________________________________________   INITIAL IMPRESSION / ASSESSMENT AND PLAN / ED COURSE  As part of my medical decision making, I reviewed the following data within the electronic medical record, if available:  Nursing notes reviewed and incorporated, Labs reviewed, EKG interpreted, Old chart reviewed, Radiograph reviewed and Notes from prior ED visits reviewed and incorporated        Patients symptoms not typical for emergent causes of abdominal pain such as, but not limited to, appendicitis, abdominal aortic aneurysm, surgical biliary disease, pancreatitis, SBO, mesenteric ischemia, serious intra-abdominal bacterial illness. Presentation also not typical of gynecologic emergencies such as TOA, Ovarian Torsion, PID. Not Ectopic. Doubt atypical ACS.  Pt tolerating PO. Disposition: Patient will be discharged  with strict return precautions and follow up with primary MD within 12-24 hours for further evaluation. Patient understands that this still may have an early presentation of an emergent medical  condition such as appendicitis that will require a recheck.      ____________________________________________   FINAL CLINICAL IMPRESSION(S) / ED DIAGNOSES  Final diagnoses:  Epigastric pain  Postprandial abdominal pain in right upper quadrant     ED Discharge Orders          Ordered    dicyclomine (BENTYL) 10 MG capsule  3 times daily before meals & bedtime        03/21/21 2205             Note:  This document was prepared using Dragon voice recognition software and may include unintentional dictation errors.    Merwyn Katos, MD 03/21/21 [phone]

## 2021-03-21 NOTE — ED Triage Notes (Signed)
Pt comes with c/o abdominal pain for few days. Pt states  she woke up this am with dry mouth. Pt denies any fever or chills. Pt denies any urinary symptoms.  Pt state some nausea after eating. Pt states some vomiting and diarrhea.

## 2021-03-21 NOTE — ED Provider Notes (Signed)
  Emergency Medicine Provider Triage Evaluation Note  Angela Sharp , a 50 y.o.adult,  was evaluated in triage.  Pt complains of abdominal pain.  Patient states she has been experiencing abdominal pain for the past 3 days.  Slowly increasing over time.  Reports pain diffusely across her abdomen, with worse pain in the upper right and upper left.  Additionally endorses nausea and diarrhea   Review of Systems  Positive: Abdominal pain, nausea, diarrhea Negative: Denies fever, chest pain, vomiting  Physical Exam   Vitals:   03/21/21 1738  BP: 112/72  Pulse: 73  Resp: 18  Temp: 98.4 F (36.9 C)  SpO2: 99%   Gen:   Awake, no distress   Resp:  Normal effort  MSK:   Moves extremities without difficulty  Other:  Diffuse tenderness across entire abdomen.  Medical Decision Making  Given the patient's initial medical screening exam, the following diagnostic evaluation has been ordered. The patient will be placed in the appropriate treatment space, once one is available, to complete the evaluation and treatment. I have discussed the plan of care with the patient and I have advised the patient that an ED physician or mid-level practitioner will reevaluate their condition after the test results have been received, as the results may give them additional insight into the type of treatment they may need.    Diagnostics: Labs, abdominal CT, respiratory panel, UA.  Treatments: none immediately   Varney Daily, [place] 03/21/21 1747    Merwyn Katos, MD 03/21/21 2350
